# Patient Record
Sex: Male | Born: 1950 | Race: Black or African American | Hispanic: No | Marital: Single
Health system: Southern US, Community
[De-identification: ages and names within clinical notes are randomized; demographics above are authoritative.]

---

## 2007-11-23 ENCOUNTER — Other Ambulatory Visit: Payer: Self-pay

## 2007-11-23 ENCOUNTER — Emergency Department: Payer: Self-pay | Admitting: Emergency Medicine

## 2010-03-10 ENCOUNTER — Emergency Department: Payer: Self-pay | Admitting: Emergency Medicine

## 2012-06-23 IMAGING — CR DG ABDOMEN 3V
1 series · 4 of 4 positions shown · non-contrast
Comparison: none

REASON FOR EXAM: upper abd pain and bilious emesis
COMMENTS:

[Series 1: view not recorded · 0.17mm/px · 4 of 4 slices shown]
[im 1/4]
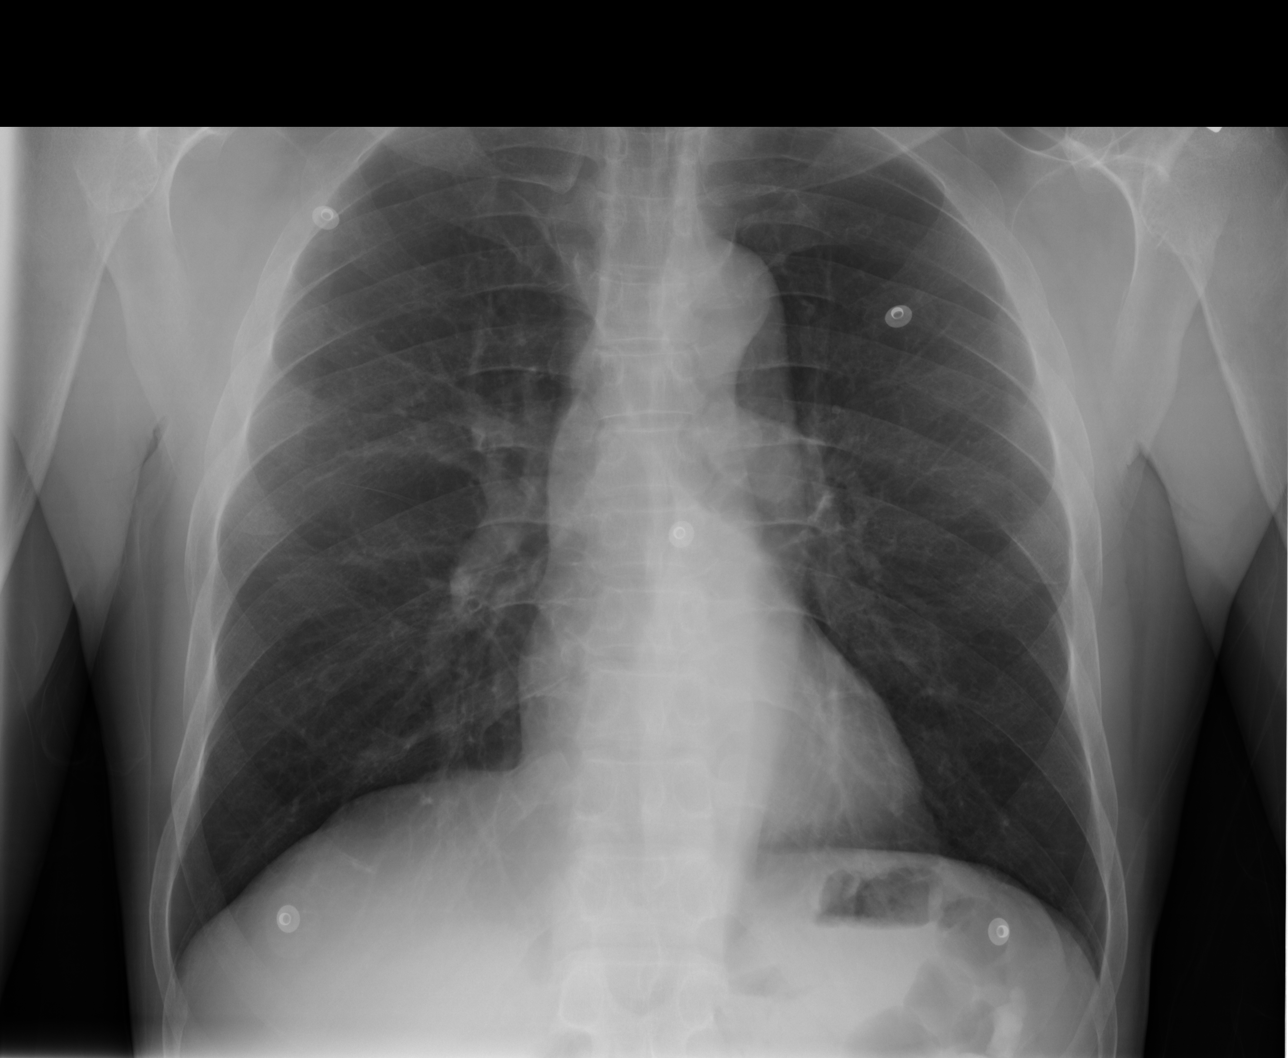
[im 2/4]
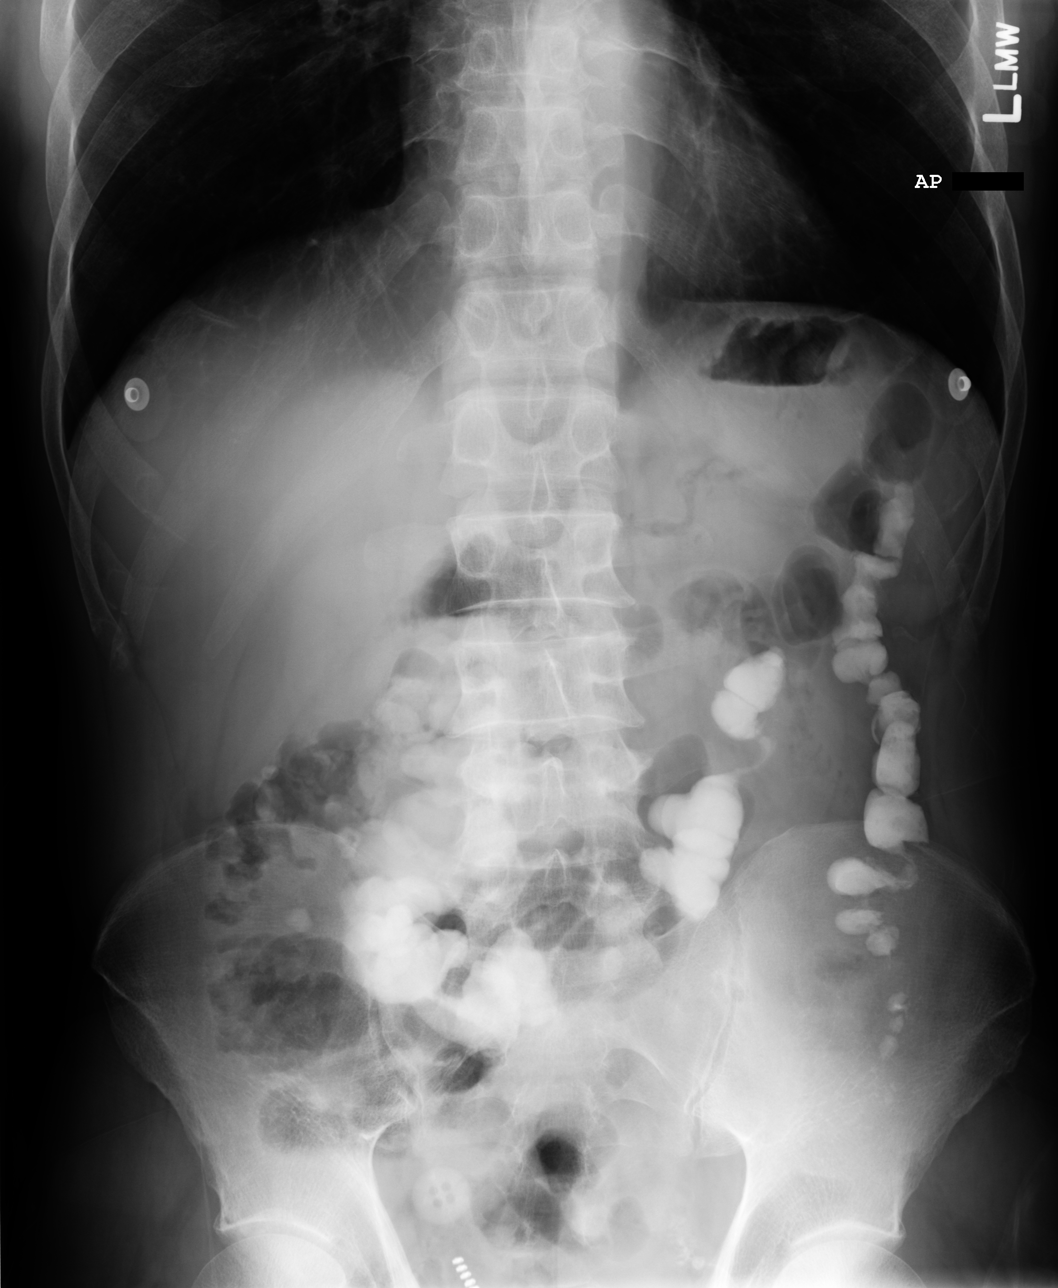
[im 3/4]
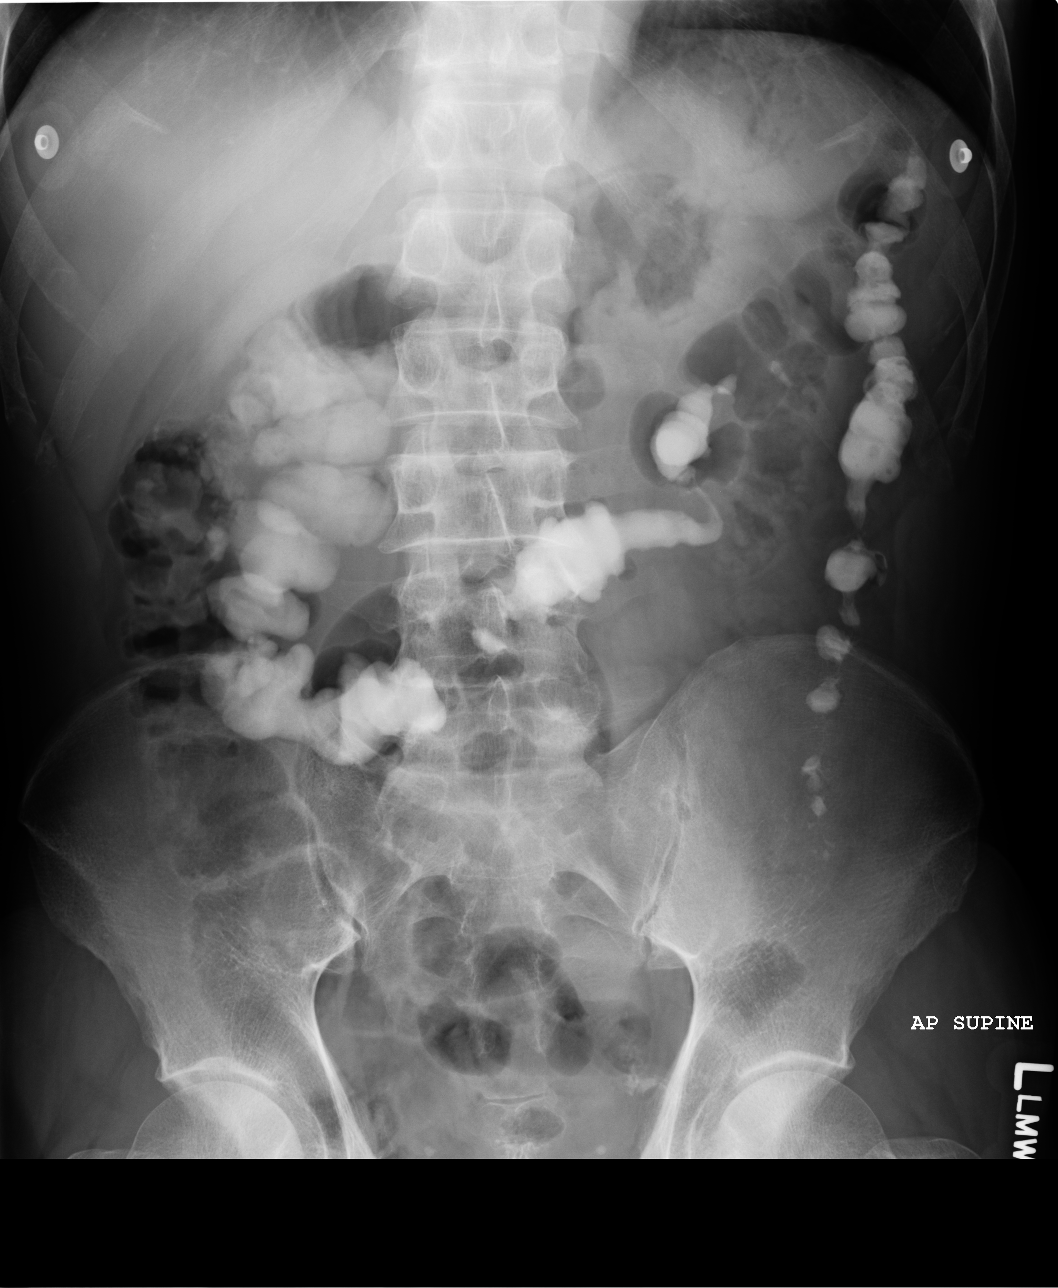
[im 4/4]
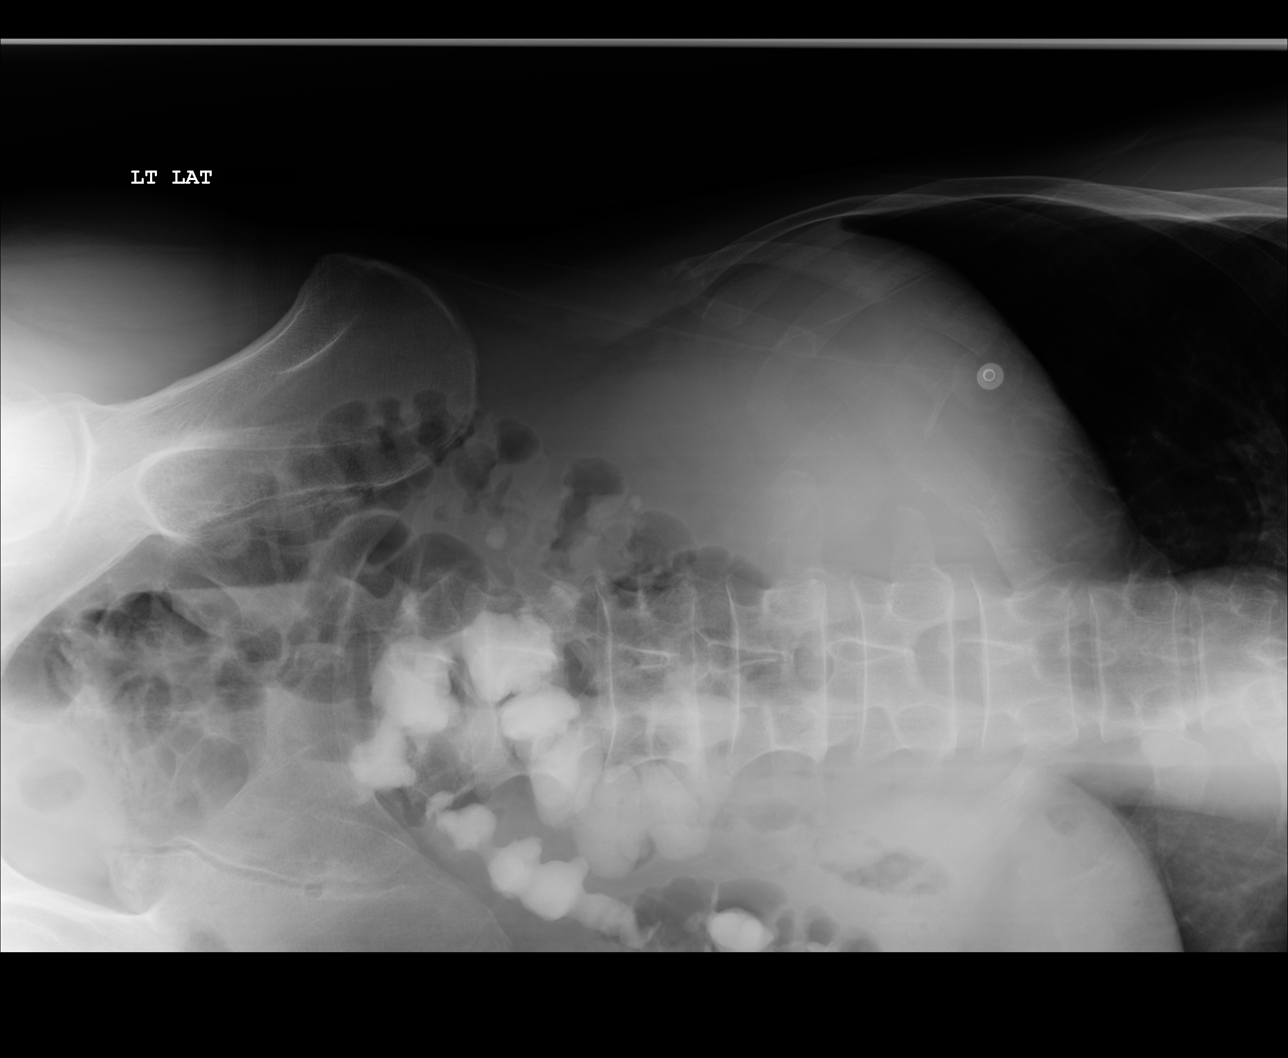

[4 of 4 positions shown; findings below may reference images not displayed]

PROCEDURE:     DXR - DXR ABDOMEN 3-WAY (INCL PA CXR)  - March 10, 2010  [DATE]

RESULT:     Comparison is made to the prior exam of 11/23/2007. The lung
fields are clear. Heart size is normal. Three views of the abdomen were
obtained. There is observed contrast material in the colon consistent with
residual contrast from prior abdominal CT. No dilated bowel loops are seen.
No findings suspicious for bowel obstruction are identified. No definitely
abnormal intraabdominal calcifications are seen. A few phleboliths are noted
in the pelvis. No acute bony abnormalities are seen. No subdiaphragmatic
free air is identified.
IMPRESSION: 1.     No acute changes are identified.

## 2012-06-23 IMAGING — CT CT ABD-PELV W/ CM
1 of 2 series · 15 of 32 positions shown, 19 images · IV contrast (isovue)
Comparison: None

REASON FOR EXAM: (1) upper abd pain and bilious emesis x 3 days; (2) see
above
COMMENTS:

PROCEDURE:     CT  - CT ABDOMEN / PELVIS  W  - March 10, 2010 [DATE]
RESULT:     History: Abdominal pain
TECHNIQUE: Multiple axial images of the abdomen and pelvis were performed
from the lung bases to the pubic symphysis, with p.o. contrast and with 100
ml of Isovue 370 intravenous contrast.

[Series 2: 3mm soft tissue · axial · 0.62mm/px · z∈[-490,-100]mm · 15 of 142 slices shown, 19 images]
[im 6/142  soft-tissue]
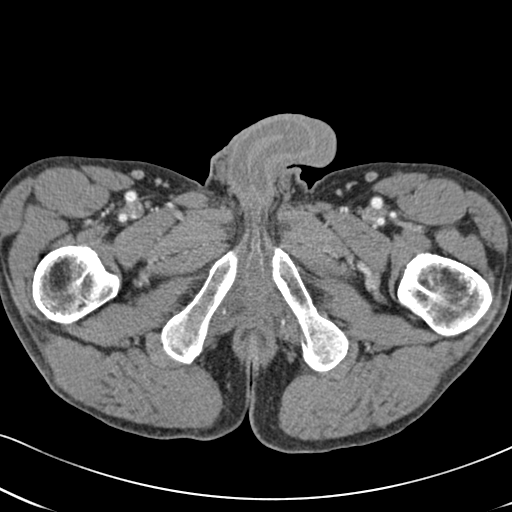
[im 6/142  bone]
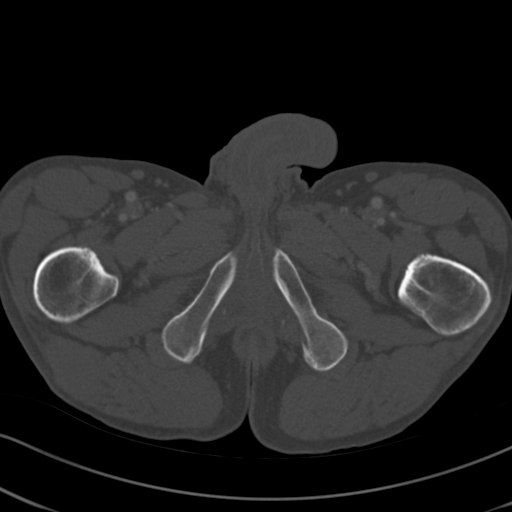
[im 17/142  soft-tissue]
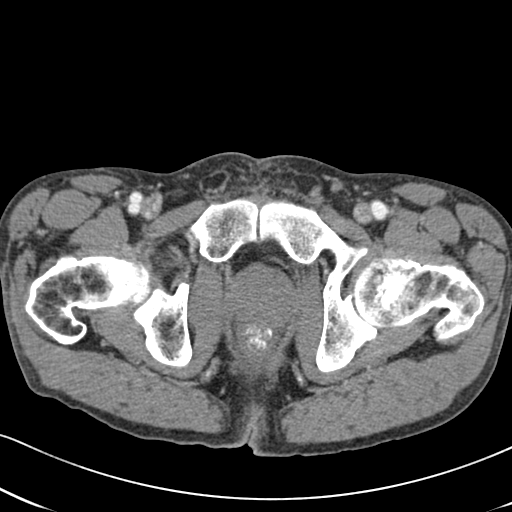
[im 29/142  soft-tissue]
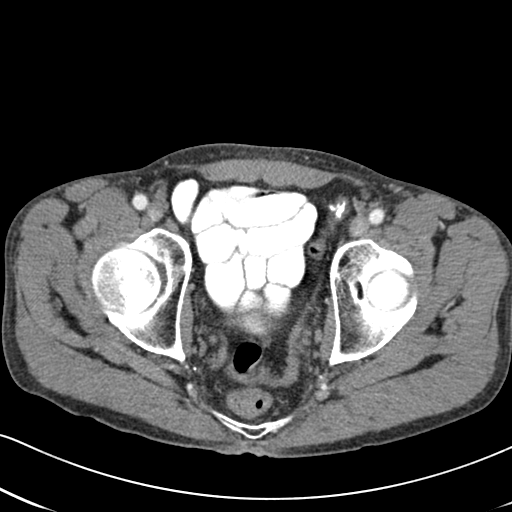
[im 40/142  soft-tissue]
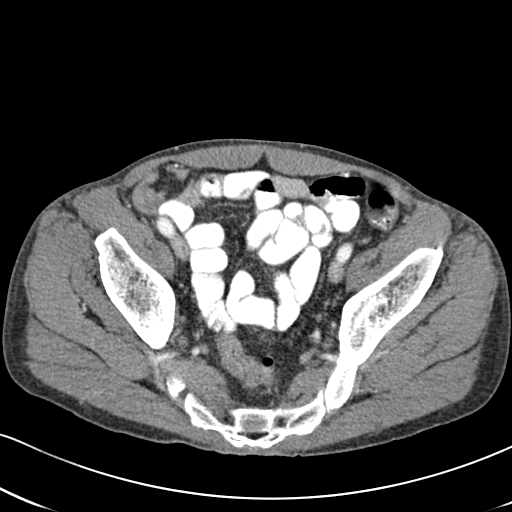
[im 51/142  soft-tissue]
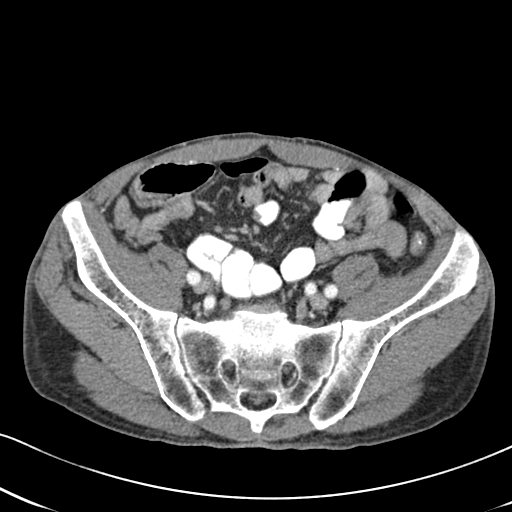
[im 63/142  soft-tissue]
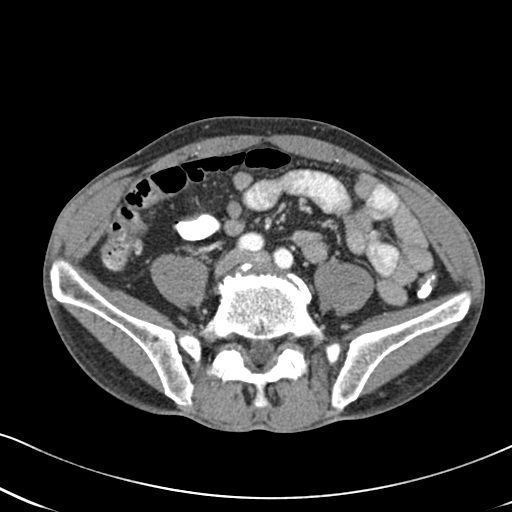
[im 74/142  soft-tissue]
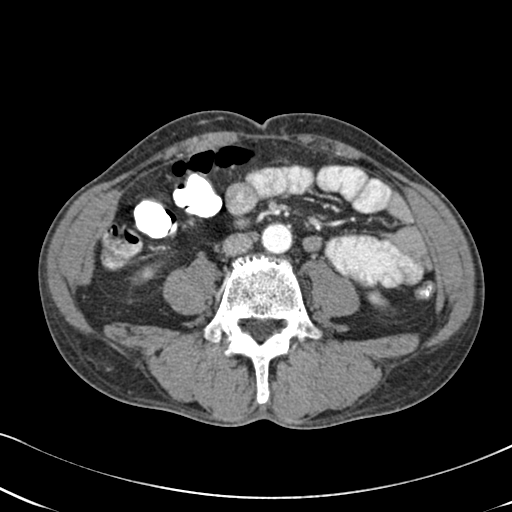
[im 79/142  soft-tissue]
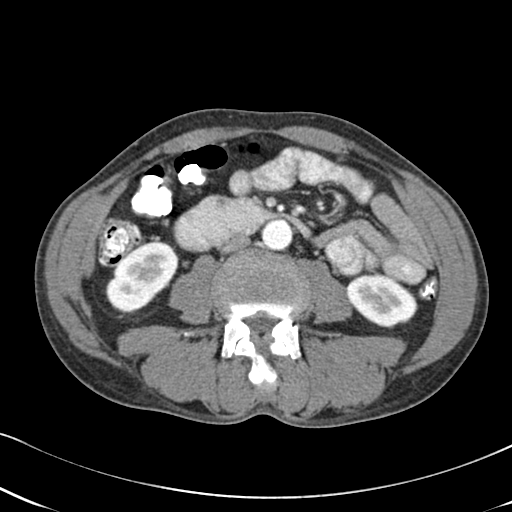
[im 91/142  soft-tissue]
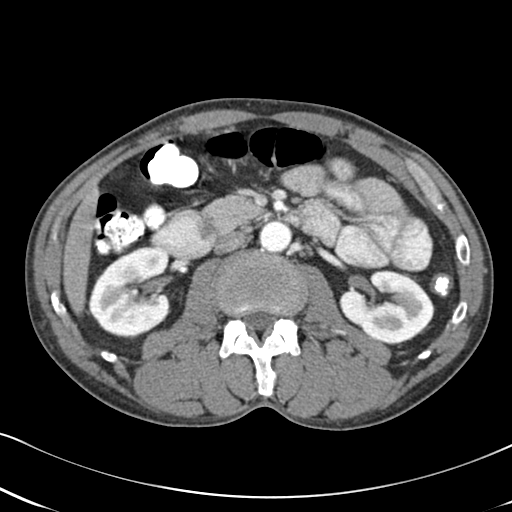
[im 91/142  bone]
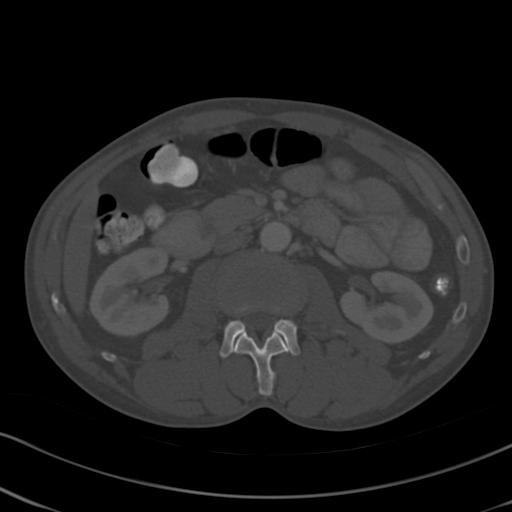
[im 102/142  soft-tissue]
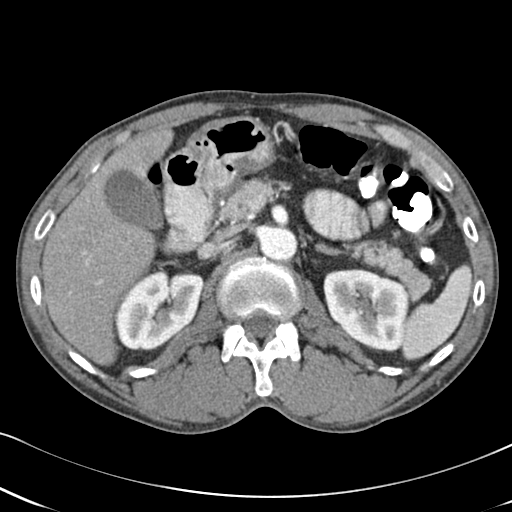
[im 113/142  soft-tissue]
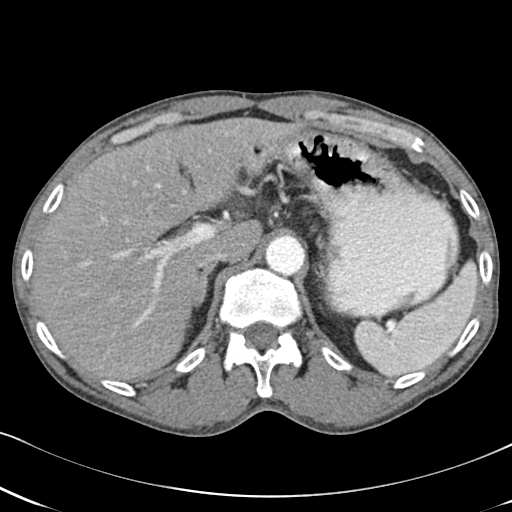
[im 119/142  lung]
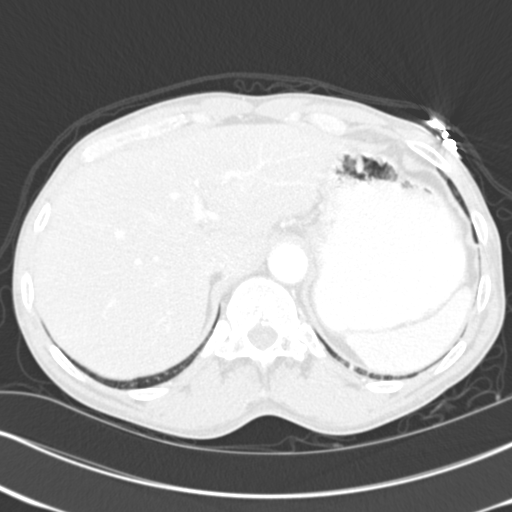
[im 125/142  soft-tissue]
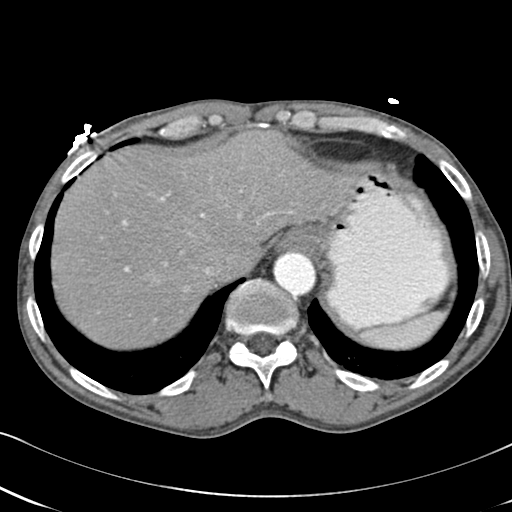
[im 125/142  lung]
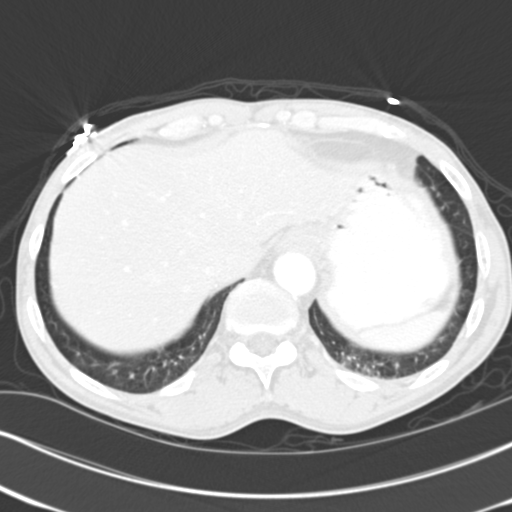
[im 130/142  lung]
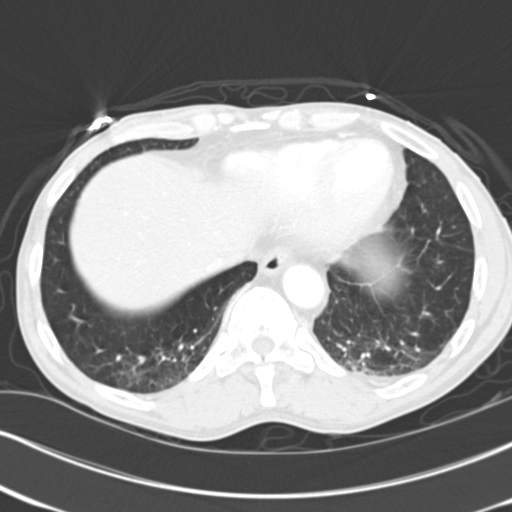
[im 136/142  soft-tissue]
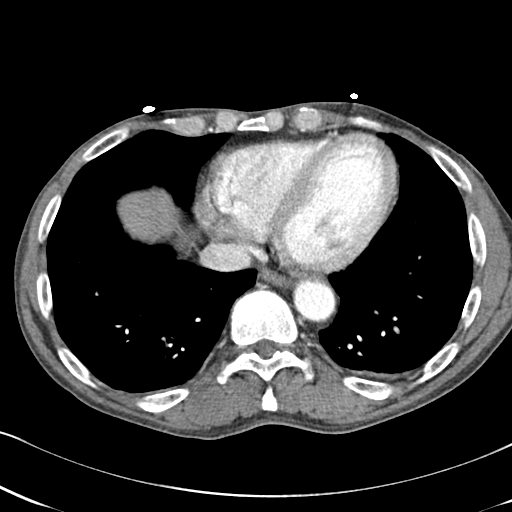
[im 136/142  lung]
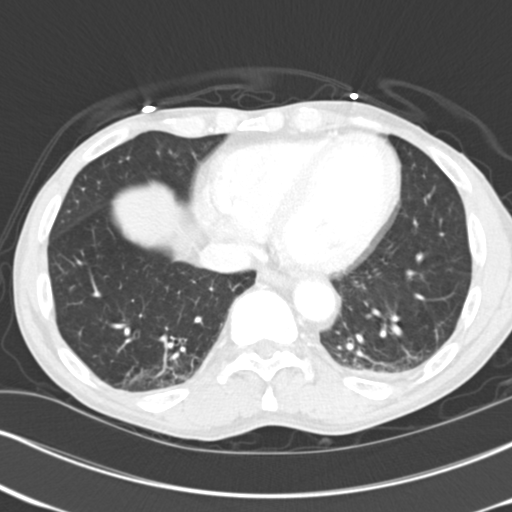

[15 of 32 positions shown; findings below may reference images not displayed]

FINDINGS: The lung bases are clear. There is no pneumothorax. The heart size is
normal.

The liver demonstrates no focal abnormality. There is no intrahepatic or
extrahepatic biliary ductal dilatation. The gallbladder is unremarkable. The
spleen demonstrates no focal abnormality. The kidneys, adrenal glands, and
pancreas are normal. The bladder is unremarkable.

The stomach, duodenum, small intestine, and large intestine demonstrate no
contrast extravasation or dilatation.  There is no pneumoperitoneum,
pneumatosis, or portal venous gas. There is no abdominal or pelvic free
fluid. There is no lymphadenopathy.

The abdominal aorta is normal in caliber .

The osseous structures are unremarkable.
IMPRESSION: No acute abdominal or pelvic pathology.

## 2013-12-28 ENCOUNTER — Inpatient Hospital Stay: Payer: Self-pay | Admitting: Internal Medicine

## 2013-12-28 LAB — TROPONIN I
Troponin-I: 0.07 ng/mL — ABNORMAL HIGH
Troponin-I: 0.07 ng/mL — ABNORMAL HIGH
Troponin-I: 0.09 ng/mL — ABNORMAL HIGH

## 2013-12-28 LAB — DRUG SCREEN, URINE

## 2013-12-28 LAB — COMPREHENSIVE METABOLIC PANEL
ALBUMIN: 3 g/dL — AB (ref 3.4–5.0)
Alkaline Phosphatase: 136 U/L — ABNORMAL HIGH
Anion Gap: 9 (ref 7–16)
BUN: 7 mg/dL (ref 7–18)
Bilirubin,Total: 0.3 mg/dL (ref 0.2–1.0)
CHLORIDE: 111 mmol/L — AB (ref 98–107)
Calcium, Total: 8.1 mg/dL — ABNORMAL LOW (ref 8.5–10.1)
Co2: 26 mmol/L (ref 21–32)
Creatinine: 0.91 mg/dL (ref 0.60–1.30)
EGFR (African American): >60
GLUCOSE: 135 mg/dL — AB (ref 65–99)
OSMOLALITY: 291 (ref 275–301)
Potassium: 3.4 mmol/L — ABNORMAL LOW (ref 3.5–5.1)
SGOT(AST): 246 U/L — ABNORMAL HIGH (ref 15–37)
SGPT (ALT): 56 U/L
Sodium: 146 mmol/L — ABNORMAL HIGH (ref 136–145)
Total Protein: 6.2 g/dL — ABNORMAL LOW (ref 6.4–8.2)

## 2013-12-28 LAB — HEPARIN LEVEL (UNFRACTIONATED): Anti-Xa(Unfractionated): 0.28 IU/mL — ABNORMAL LOW (ref 0.30–0.70)

## 2013-12-28 LAB — URINALYSIS, COMPLETE
BACTERIA: NONE SEEN
BILIRUBIN, UR: NEGATIVE
Blood: NEGATIVE
GLUCOSE, UR: NEGATIVE mg/dL (ref 0–75)
Hyaline Cast: 27
Ketone: NEGATIVE
Leukocyte Esterase: NEGATIVE
NITRITE: NEGATIVE
Ph: 5 (ref 4.5–8.0)
RBC,UR: NONE SEEN /HPF (ref 0–5)
SPECIFIC GRAVITY: 1.035 (ref 1.003–1.030)
WBC UR: 3 /HPF (ref 0–5)

## 2013-12-28 LAB — CBC WITH DIFFERENTIAL/PLATELET
BASOS PCT: 1.1 %
Basophil #: 0 10*3/uL (ref 0.0–0.1)
EOS ABS: 0 10*3/uL (ref 0.0–0.7)
Eosinophil %: 0.4 %
HCT: 40.7 % (ref 40.0–52.0)
HGB: 13.4 g/dL (ref 13.0–18.0)
LYMPHS PCT: 40 %
Lymphocyte #: 1.7 10*3/uL (ref 1.0–3.6)
MCH: 34.2 pg — ABNORMAL HIGH (ref 26.0–34.0)
MCHC: 32.8 g/dL (ref 32.0–36.0)
MCV: 104 fL — ABNORMAL HIGH (ref 80–100)
MONO ABS: 0.2 x10 3/mm (ref 0.2–1.0)
Monocyte %: 5.6 %
NEUTROS PCT: 52.9 %
Neutrophil #: 2.3 10*3/uL (ref 1.4–6.5)
PLATELETS: 170 10*3/uL (ref 150–440)
RBC: 3.9 10*6/uL — AB (ref 4.40–5.90)
RDW: 15.1 % — ABNORMAL HIGH (ref 11.5–14.5)
WBC: 4.3 10*3/uL (ref 3.8–10.6)

## 2013-12-28 LAB — CK TOTAL AND CKMB (NOT AT ARMC)
CK, TOTAL: 130 U/L
CK, Total: 177 U/L
CK, Total: 100 U/L
CK-MB: 1.6 ng/mL (ref 0.5–3.6)
CK-MB: 1.2 ng/mL (ref 0.5–3.6)
CK-MB: 1.3 ng/mL (ref 0.5–3.6)

## 2013-12-28 LAB — LIPASE, BLOOD: Lipase: 206 U/L (ref 73–393)

## 2013-12-28 LAB — APTT: ACTIVATED PTT: 30.7 s (ref 23.6–35.9)

## 2013-12-28 LAB — PROTIME-INR
INR: 0.9
Prothrombin Time: 12.2 secs (ref 11.5–14.7)

## 2013-12-29 LAB — CBC WITH DIFFERENTIAL/PLATELET
BASOS PCT: 0.9 %
Basophil #: 0 10*3/uL (ref 0.0–0.1)
Eosinophil #: 0 10*3/uL (ref 0.0–0.7)
Eosinophil %: 0.8 %
HCT: 42.7 % (ref 40.0–52.0)
HGB: 14 g/dL (ref 13.0–18.0)
LYMPHS PCT: 43.9 %
Lymphocyte #: 1.7 10*3/uL (ref 1.0–3.6)
MCH: 34.6 pg — ABNORMAL HIGH (ref 26.0–34.0)
MCHC: 32.7 g/dL (ref 32.0–36.0)
MCV: 106 fL — ABNORMAL HIGH (ref 80–100)
MONO ABS: 0.3 x10 3/mm (ref 0.2–1.0)
Monocyte %: 8.8 %
Neutrophil #: 1.8 10*3/uL (ref 1.4–6.5)
Neutrophil %: 45.6 %
Platelet: 158 10*3/uL (ref 150–440)
RBC: 4.04 10*6/uL — AB (ref 4.40–5.90)
RDW: 15.3 % — ABNORMAL HIGH (ref 11.5–14.5)
WBC: 4 10*3/uL (ref 3.8–10.6)

## 2013-12-29 LAB — BASIC METABOLIC PANEL
ANION GAP: 8 (ref 7–16)
BUN: 6 mg/dL — AB (ref 7–18)
CALCIUM: 8.6 mg/dL (ref 8.5–10.1)
CHLORIDE: 110 mmol/L — AB (ref 98–107)
CO2: 28 mmol/L (ref 21–32)
Creatinine: 0.91 mg/dL (ref 0.60–1.30)
EGFR (African American): >60
Glucose: 72 mg/dL (ref 65–99)
OSMOLALITY: 287 (ref 275–301)
Potassium: 4.6 mmol/L (ref 3.5–5.1)
Sodium: 146 mmol/L — ABNORMAL HIGH (ref 136–145)

## 2013-12-29 LAB — LIPID PANEL
CHOLESTEROL: 131 mg/dL (ref 0–200)
HDL: 81 mg/dL — AB (ref 40–60)
Ldl Cholesterol, Calc: 39 mg/dL (ref 0–100)
TRIGLYCERIDES: 54 mg/dL (ref 0–200)
VLDL Cholesterol, Calc: 11 mg/dL (ref 5–40)

## 2013-12-29 LAB — MAGNESIUM: MAGNESIUM: 1.9 mg/dL

## 2013-12-29 LAB — HEPARIN LEVEL (UNFRACTIONATED): ANTI-XA(UNFRACTIONATED): 0.44 [IU]/mL (ref 0.30–0.70)

## 2014-08-22 NOTE — H&P (Signed)
PATIENT NAME:  Clifford Stewart, CHOPIN MR#:  [id number] DATE OF BIRTH:  1950-06-03  DATE OF ADMISSION:  12/28/2013  REFERRING PHYSICIAN: Dr. Derrill Kay.   PRIMARY CARE PHYSICIAN: None.   CHIEF COMPLAINT: Chest pain.   HISTORY OF PRESENT ILLNESS: This is a very pleasant 64 year old male with past medical history significant for smoking and alcohol abuse, presents today with chest pain and shortness of breath, which started this morning after waking. He reports that at the time of onset of symptoms he was at work lifting and sorting tobacco. Chest pain is 10/10 in intensity at its worst. Pain is over the entire chest and radiates up to the left neck. He has also had diaphoresis and presyncope. He denies any prior similar episodes. He reports that in the past few days he has been feeling generally fatigued and has noticed shortness of breath with exertion. In the emergency room his troponin is found to be 0.07, no EKG changes. The hospitalist service is asked to admit for non ST elevation MI.   PAST MEDICAL HISTORY: None.   PAST SURGICAL HISTORY: None.   ALLERGIES: No known allergies.   HOME MEDICATIONS: No home medications.   FAMILY HISTORY: There is no family history of coronary artery disease or stroke. He reports that multiple family members have had lung cancer, mainly his grandparents and mother.   SOCIAL HISTORY: The patient lives alone. He smokes half a pack of cigarettes per day. He reports that he drinks about one-half pint of alcohol per day. He is accompanied by his coworker or boss who reports that his alcohol consumption is much higher. The patient denies any illicit substance abuse. His coworker or boss reports that the patient does use illicit substances but does not elaborate. The patient is employed as a farm labor.   REVIEW OF SYSTEMS:  CONSTITUTIONAL: Positive for fatigue, decreased exercise tolerance. Negative for weight change, fevers, or chills.  HEENT: No change in vision, change  in hearing, pain in the ears or eyes. No difficulty swallowing, no throat pain.  RESPIRATORY: Positive as above for shortness of breath with exertion, no cough, hemoptysis, orthopnea, paroxysmal nocturnal dyspnea, wheezing.  CARDIOVASCULAR: Positive for chest pain, no palpitations, no syncope.  GASTROINTESTINAL: No nausea, vomiting, diarrhea. No abdominal pain.  GENITOURINARY: No frequency or dysuria noted.  MUSCULOSKELETAL: No tender or swollen joints. No recent injuries, no weakness.  NEUROLOGIC: No headaches, seizure, syncope, numbness, confusion.  PSYCHIATRIC: The patient is currently very anxious. Denies any recent changes in mood.   PHYSICAL EXAMINATION:  VITAL SIGNS: Temperature 98.2, pulse 50, respirations 18, blood pressure 156/88, pulse oximetry 98% on room air.  GENERAL: The patient is very anxious. No acute distress.  HEENT: Pupils are equal and reactive to light. Extraocular motion is intact. Conjunctivae are clear. There is no icterus. Oral mucous membranes are pink and moist. There are no oral lesions, no exudate. Trachea is midline. No cervical lymphadenopathy. No thyroid tenderness or nodule noted.  RESPIRATORY: Lungs are clear to auscultation bilaterally with good air movement.  CARDIOVASCULAR: Regular rate and rhythm, no murmurs, rubs, or gallops.  ABDOMEN: Bowel sounds are positive. The patient tenses his abdomen making it difficult to examine. There did not seem to be any abdominal tenderness, rebound, or guarding. No hepatosplenomegaly or mass.  MUSCULOSKELETAL: Range of motion is normal in all extremities, no tender or swollen joints, strength is 4/4.  NEUROLOGIC: Cranial nerves II through XII are grossly intact. Nonfocal neurologic exam.  PSYCHIATRIC: The patient is anxious. Reports  that he is very concerned about his current diagnosis. He is alert and oriented x 4 with good insight and good judgment.   ASSESSMENT AND PLAN:  1. Non ST elevation myocardial infarction.  Heparin has been started in the Emergency Room. The patient is currently chest pain free. We will continue to cycle cardiac enzymes. Have started statin, beta blocker, nitrate therapy. A 2D echocardiogram is ordered. Cardiology has been consulted.  2. Hypertension: The patient does not have a history of hypertension as an outpatient. He does not; however, follow up with a primary care provider. Have initiated beta blocker and nitrate therapy. We will provide hydralazine p.r.n. for systolic blood pressures over [id number].  3. Hypokalemia: Replete potassium. Check magnesium and recheck in the morning.  4. Alcohol abuse: Will initiate the CIWA protocol. Have ordered a clinical social work consultation for substance abuse. There is also a possibility of illicit substance abuse. The patient denies, through his friend who is accompanying him today reports that there is ongoing substance abuse.  5. Tobacco abuse: Smoking cessation counseling provided today by me.  6. Macrocytosis without anemia: This is likely due to alcohol abuse. We will check a B12 level.  7. Prophylaxis: The patient is currently heparinized for non-ST segment elevation myocardial infarction. We will start Protonix for gastrointestinal prophylaxis.   TIME SPENT ON THIS ADMISSION: 40 minutes.   ____________________________ Ena Dawleyatherine P. Clent RidgesWalsh, MD cpw:lt D: 12/28/2013 15:50:14 ET T: 12/28/2013 16:03:07 ET JOB#: [id number]  cc: Santina Evansatherine P. Clent RidgesWalsh, MD, <Dictator> Gale JourneyATHERINE P WALSH MD ELECTRONICALLY SIGNED 12/29/2013 5:38

## 2014-08-22 NOTE — Consult Note (Signed)
PATIENT NAME:  Clifford DalesRICHMOND, Clifford Stewart#:  [id number] DATE OF BIRTH:  Sep 29, 1950  DATE OF CONSULTATION:  12/28/2013  REFERRING PHYSICIAN:   CONSULTING PHYSICIAN:  Laurier NancyShaukat A. Trinitie Mcgirr, MD  INDICATION FOR CONSULTATION: Chest pain.   HISTORY OF PRESENT ILLNESS: This is a 64 year old African American male with a past medical history of EtOH abuse, presented to the Emergency Room after a having a severe episode of chest pain. The patient states he had 10/10 chest pain, he never felt like this before. He had to call 911, ambulance came, they treated it with morphine and nitroglycerin. Right now he is chest pain free. His initial troponin was 0.07. There are no acute EKG changes. When he presented, he felt like he was going to pass out and was very diaphoretic.   PAST MEDICAL HISTORY: History of EtOH abuse.   ALLERGIES: None.    FAMILY HISTORY: There is no history of coronary artery disease in the family.   SOCIAL HISTORY: The patient lives alone, smokes about 1/2 pack per day and drinks about 1.5 pints of alcohol daily, no illicit drug use.   PHYSICAL EXAMINATION:  GENERAL: He is alert, oriented x 3, in no acute distress right now.  VITAL SIGNS: His blood pressure is 156/88, respirations 18, pulse is 60, temperature 98.2, saturation 98.  NECK: No JVD.  LUNGS: Clear.  HEART: Regular rate and rhythm. Normal S1, S2. No audible murmur.  ABDOMEN: Soft, nontender, positive bowel sounds.  EXTREMITIES: No pedal edema.  NEUROLOGIC: The patient appears to be intact.   LABORATORY DATA: His troponin is 0.07. BUN is 7, creatinine is 0.91. His SGOT is 246, SGPT is 56.    IMAGING: EKG shows normal sinus rhythm, 64 beats per minute, poor R wave progression suggestive of questionable overload, anteroseptal wall MI, nonspecific ST-T changes.   ASSESSMENT AND PLAN: The patient has severe episode of chest pain at rest, left-sided pressure type associated with shortness of breath, diaphoresis and presyncope with 2 sets  of troponin being positive. Advise further evaluation to rule out coronary artery disease by doing cardiac catheterization.   ____________________________ Laurier NancyShaukat A. Lilie Vezina, MD sak:TT D: 12/28/2013 17:14:50 ET T: 12/28/2013 17:25:46 ET JOB#: [id number]  cc: Laurier NancyShaukat A. Caidance Sybert, MD, <Dictator> Laurier NancySHAUKAT A Jansel Vonstein MD ELECTRONICALLY SIGNED 01/15/2014 13:30

## 2014-08-22 NOTE — Discharge Summary (Signed)
PATIENT NAME:  Clifford Stewart, Prather W MR#:  [id number] DATE OF BIRTH:  1951-04-11  DATE OF ADMISSION:  12/28/2013 DATE OF DISCHARGE:  12/29/2013  ADMITTING PHYSICIAN:  Gale Journeyatherine P Walsh, MD   DISCHARGING PHYSICIAN:  Enid Baasadhika Reghan Thul, MD   PRIMARY CARE PHYSICIAN: None.   CONSULTATIONS IN THE HOSPITAL: Cardiology consultation by Laurier NancyShaukat A Khan, MD   DISCHARGE DIAGNOSES: 1.  Musculoskeletal chest pain status post cardiac catheterization with normal coronaries.  2.  Demand ischemia with elevated troponins on admission.  3.  Alcohol abuse.  4.  Tobacco use disorder.  5.  Hypokalemia.  6.  Alcoholic hepatitis.   MEDICATIONS: None.   DISCHARGE DIET: Regular diet.   DISCHARGE ACTIVITY: As tolerated.   FOLLOWUP INSTRUCTIONS:  PCP follow-up in 2 weeks.   LABORATORIES AND IMAGING STUDIES PRIOR TO DISCHARGE:  1.  WBC 4.0, hemoglobin 14.3, hematocrit 42.7, platelet count is 158,000.  2.  Sodium 146, potassium 4.6, chloride 110, bicarbonate 28, BUN 6, creatinine 0.91, glucose 72, and calcium of 8.6, magnesium 1.9; troponins were elevated at 0.07 and 0.09, LDL cholesterol 39, HDL 81, total cholesterol 045151, triglycerides 54.  3.  Echo Doppler revealing normal LV ejection fraction, EF of 50% to 55%, impaired relaxation pattern, trileaflet pulmonic valve without evidence of stenosis or insufficiency, and cardiac catheterization revealing normal coronaries, LV ejection fraction is 55%.   BRIEF HOSPITAL COURSE: Mr. Clifford Stewart is a 64 year old African American male with no significant past medical history not on any home medications with history of alcohol use, and smoking, presents to the hospital secondary to generalized chest pain radiating to the back.   1) Chest pain and slightly elevated troponin at 0.07; initially thought to be NSTEMI, admitted to telemetry. He was placed on heparin drip, seen by cardiologist, and had a cardiac catheterization done, which revealed normal coronaries.  His other troponins  plateaued at 0.07 and 0.09, and his chest pain resolved with 1 dose of pain medication. It was likely musculoskeletal chest pain as he had lifted heavy boxes of tobacco, a couple of days prior to the onset of the it pain, and it was  very generalized. He remained symptom-free during his hospital course, once his tests are negative, he is being discharged home. He has been counseled against smoking and alcohol use. His course has been otherwise uneventful in the hospital.   DISCHARGE CONDITION: Stable.   DISCHARGE DISPOSITION: Home.   TIME SPENT ON DISCHARGE:  Was 40 minutes.    ____________________________ Enid Baasadhika Somtochukwu Woollard, MD rk:nt D: 12/29/2013 12:12:43 ET T: 12/29/2013 15:51:06 ET JOB#: [id number]  cc: Enid Baasadhika Kerra Guilfoil, MD, <Dictator> Enid BaasADHIKA Analisa Sledd MD ELECTRONICALLY SIGNED 01/01/2014 13:53

## 2015-06-02 DEATH — deceased

## 2016-04-12 IMAGING — CR DG CHEST 2V
1 series · 2 of 2 positions shown · non-contrast
Comparison: None.

CLINICAL DATA: Chest pain, shortness of Breath

EXAM:
CHEST  2 VIEW

[Series 1: dxr chest pa (or ap) and lateral · 0.14mm/px · 2 of 2 slices shown]
[im 1/2]
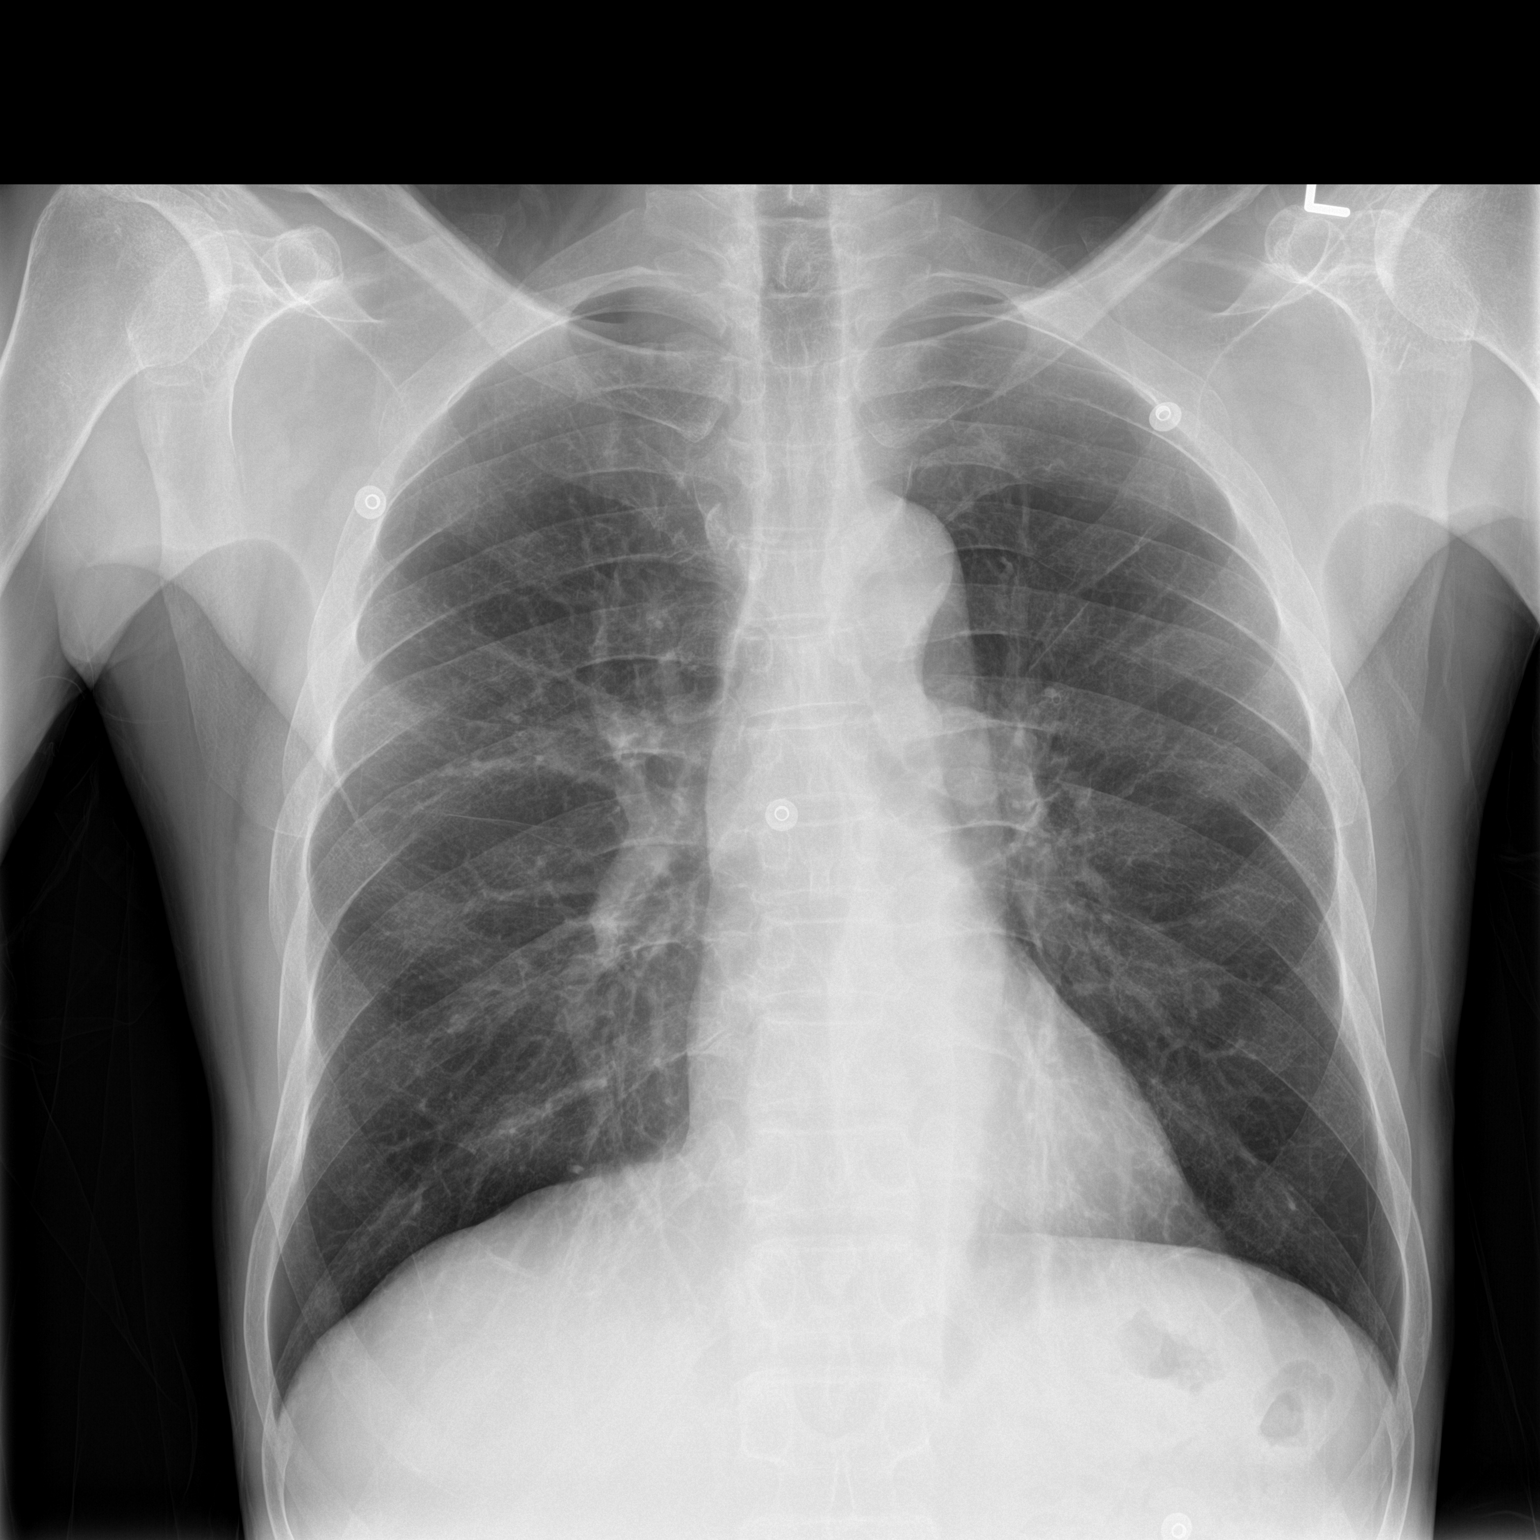
[im 2/2]
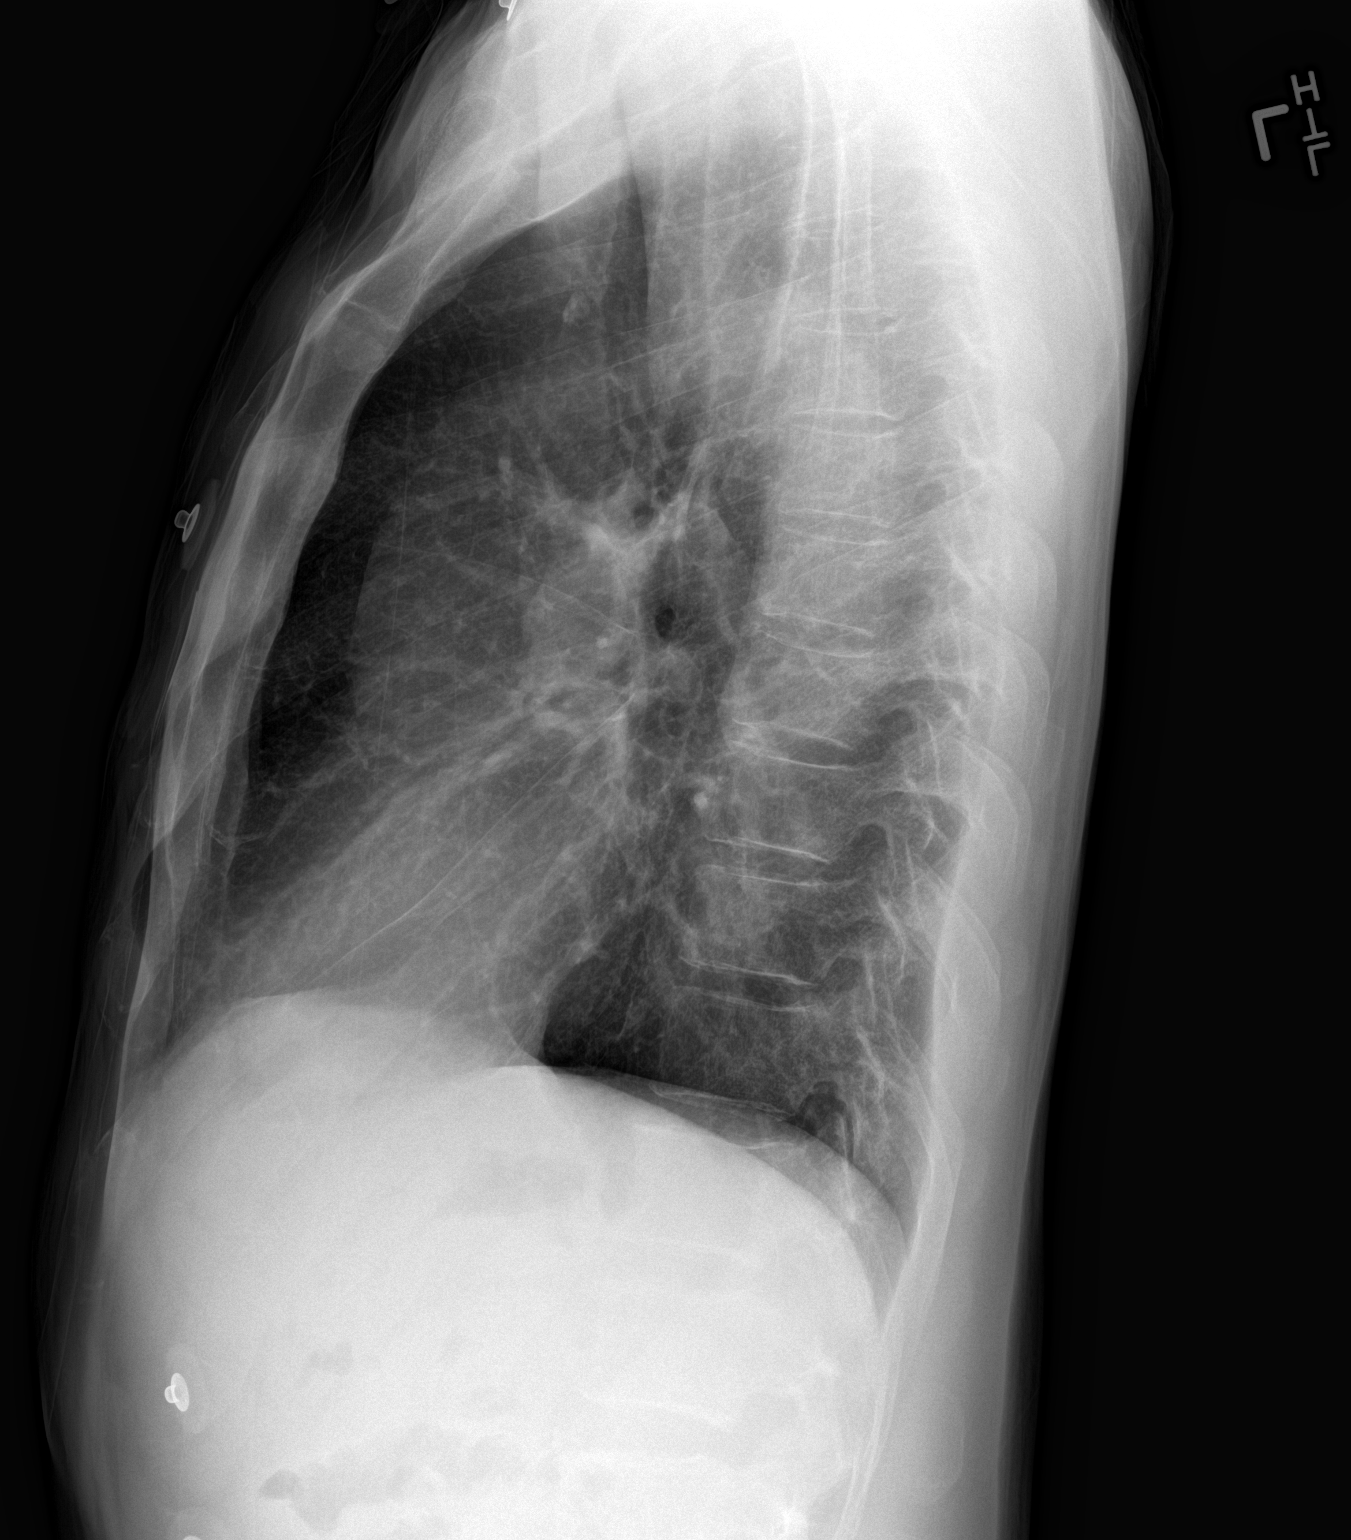

[2 of 2 positions shown; findings below may reference images not displayed]

FINDINGS: Mild hyperinflation of the lungs. Heart and mediastinal contours are
within normal limits. No focal opacities or effusions. No acute bony
abnormality.
IMPRESSION: No active cardiopulmonary disease.
